# Patient Record
Sex: Female | Born: 1995 | Hispanic: No | Marital: Married | Smoking: Never smoker
Health system: Southern US, Community
[De-identification: ages and names within clinical notes are randomized; demographics above are authoritative.]

## PROBLEM LIST (undated history)

## (undated) DIAGNOSIS — R011 Cardiac murmur, unspecified: Secondary | ICD-10-CM

---

## 2021-12-17 ENCOUNTER — Emergency Department
Admission: EM | Admit: 2021-12-17 | Discharge: 2021-12-17 | Disposition: A | Discharge status: Home / Self Care | Payer: 59 | Source: Home / Self Care | Attending: Emergency Medicine | Admitting: Emergency Medicine

## 2021-12-17 ENCOUNTER — Other Ambulatory Visit: Payer: Self-pay

## 2021-12-17 ENCOUNTER — Observation Stay
Admission: RE | Admit: 2021-12-17 | Discharge: 2021-12-17 | Disposition: A | Discharge status: Home / Self Care | Payer: 59 | Attending: Obstetrics | Admitting: Obstetrics

## 2021-12-17 ENCOUNTER — Emergency Department: Payer: 59

## 2021-12-17 ENCOUNTER — Encounter: Payer: Self-pay | Admitting: Obstetrics and Gynecology

## 2021-12-17 DIAGNOSIS — R1030 Lower abdominal pain, unspecified: Secondary | ICD-10-CM | POA: Insufficient documentation

## 2021-12-17 DIAGNOSIS — O1493 Unspecified pre-eclampsia, third trimester: Secondary | ICD-10-CM | POA: Insufficient documentation

## 2021-12-17 DIAGNOSIS — Z3A27 27 weeks gestation of pregnancy: Secondary | ICD-10-CM | POA: Insufficient documentation

## 2021-12-17 DIAGNOSIS — O99352 Diseases of the nervous system complicating pregnancy, second trimester: Principal | ICD-10-CM | POA: Insufficient documentation

## 2021-12-17 DIAGNOSIS — Z3493 Encounter for supervision of normal pregnancy, unspecified, third trimester: Secondary | ICD-10-CM

## 2021-12-17 DIAGNOSIS — R55 Syncope and collapse: Secondary | ICD-10-CM | POA: Insufficient documentation

## 2021-12-17 DIAGNOSIS — Z3A26 26 weeks gestation of pregnancy: Secondary | ICD-10-CM | POA: Insufficient documentation

## 2021-12-17 DIAGNOSIS — O26892 Other specified pregnancy related conditions, second trimester: Secondary | ICD-10-CM | POA: Diagnosis not present

## 2021-12-17 DIAGNOSIS — O26893 Other specified pregnancy related conditions, third trimester: Secondary | ICD-10-CM | POA: Insufficient documentation

## 2021-12-17 DIAGNOSIS — R519 Headache, unspecified: Secondary | ICD-10-CM | POA: Insufficient documentation

## 2021-12-17 DIAGNOSIS — O162 Unspecified maternal hypertension, second trimester: Secondary | ICD-10-CM | POA: Diagnosis not present

## 2021-12-17 HISTORY — DX: Cardiac murmur, unspecified: R01.1

## 2021-12-17 LAB — CBC WITH DIFFERENTIAL/PLATELET
Abs Immature Granulocytes: 0.48 10*3/uL — ABNORMAL HIGH (ref 0.00–0.07)
Basophils Absolute: 0.1 10*3/uL (ref 0.0–0.1)
Basophils Relative: 1 %
Eosinophils Absolute: 0.1 10*3/uL (ref 0.0–0.5)
Eosinophils Relative: 1 %
HCT: 37.1 % (ref 36.0–46.0)
Hemoglobin: 12.4 g/dL (ref 12.0–15.0)
Immature Granulocytes: 4 %
Lymphocytes Relative: 14 %
Lymphs Abs: 1.6 10*3/uL (ref 0.7–4.0)
MCH: 29.8 pg (ref 26.0–34.0)
MCHC: 33.4 g/dL (ref 30.0–36.0)
MCV: 89.2 fL (ref 80.0–100.0)
Monocytes Absolute: 0.8 10*3/uL (ref 0.1–1.0)
Monocytes Relative: 7 %
Neutro Abs: 8.2 10*3/uL — ABNORMAL HIGH (ref 1.7–7.7)
Neutrophils Relative %: 73 %
Platelets: 231 10*3/uL (ref 150–400)
RBC: 4.16 MIL/uL (ref 3.87–5.11)
RDW: 12.5 % (ref 11.5–15.5)
WBC: 11.3 10*3/uL — ABNORMAL HIGH (ref 4.0–10.5)
nRBC: 0 % (ref 0.0–0.2)

## 2021-12-17 LAB — COMPREHENSIVE METABOLIC PANEL
ALT: 29 U/L (ref 0–44)
AST: 45 U/L — ABNORMAL HIGH (ref 15–41)
Albumin: 3.4 g/dL — ABNORMAL LOW (ref 3.5–5.0)
Alkaline Phosphatase: 60 U/L (ref 38–126)
Anion gap: 8 (ref 5–15)
BUN: 8 mg/dL (ref 6–20)
CO2: 24 mmol/L (ref 22–32)
Calcium: 9.3 mg/dL (ref 8.9–10.3)
Chloride: 106 mmol/L (ref 98–111)
Creatinine, Ser: 0.49 mg/dL (ref 0.44–1.00)
GFR, Estimated: >60 mL/min (ref >60)
Glucose, Bld: 100 mg/dL — ABNORMAL HIGH (ref 70–99)
Potassium: 3.1 mmol/L — ABNORMAL LOW (ref 3.5–5.1)
Sodium: 138 mmol/L (ref 135–145)
Total Bilirubin: 0.4 mg/dL (ref 0.3–1.2)
Total Protein: 6.4 g/dL — ABNORMAL LOW (ref 6.5–8.1)

## 2021-12-17 LAB — URINALYSIS, ROUTINE W REFLEX MICROSCOPIC
Bilirubin Urine: NEGATIVE
Glucose, UA: NEGATIVE mg/dL
Hgb urine dipstick: NEGATIVE
Ketones, ur: NEGATIVE mg/dL
Leukocytes,Ua: NEGATIVE
Nitrite: NEGATIVE
Protein, ur: NEGATIVE mg/dL
Specific Gravity, Urine: 1.009 (ref 1.005–1.030)
pH: 7 (ref 5.0–8.0)

## 2021-12-17 LAB — LIPASE, BLOOD: Lipase: 36 U/L (ref 11–51)

## 2021-12-17 LAB — PROTEIN / CREATININE RATIO, URINE
Creatinine, Urine: 54 mg/dL
Total Protein, Urine: <6 mg/dL

## 2021-12-17 LAB — LACTATE DEHYDROGENASE: LDH: 161 U/L (ref 98–192)

## 2021-12-17 LAB — URIC ACID: Uric Acid, Serum: 4.2 mg/dL (ref 2.5–7.1)

## 2021-12-17 LAB — MAGNESIUM: Magnesium: 2 mg/dL (ref 1.7–2.4)

## 2021-12-17 IMAGING — MR MR HEAD W/O CM
11 series · 45 of 48 positions shown · non-contrast
Comparison: None Available.

CLINICAL DATA: Headache. Twenty-six weeks pregnant. Rule out
aneurysm.

EXAM:
MRI HEAD WITHOUT CONTRAST
MRA HEAD WITHOUT CONTRAST
MRV HEAD  WITHOUT CONTRAST
TECHNIQUE: Multiplanar, multiecho pulse sequences of the brain and surrounding
structures were obtained without intravenous contrast. Angiographic
images of the Circle of Willis were obtained using MRA technique
without intravenous contrast. MR venogram of the head was performed
without contrast. Carotid stenosis measurements (when applicable)
are obtained utilizing NASCET criteria, using the distal internal
carotid diameter as the denominator.

[Series 5: ax dwi_tracew · axial · 3.0mm · 0.65mm/px · z∈[-126,+16]mm · 5 of 48 slices shown]
[im 1/48]
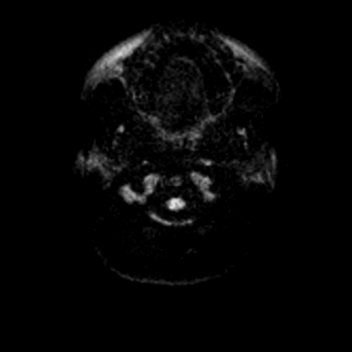
[im 12/48]
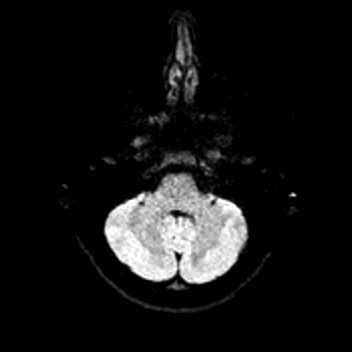
[im 24/48]
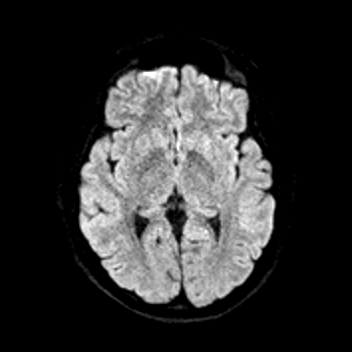
[im 36/48]
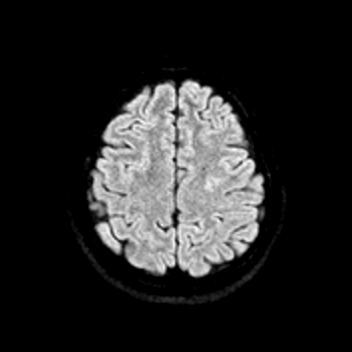
[im 48/48]
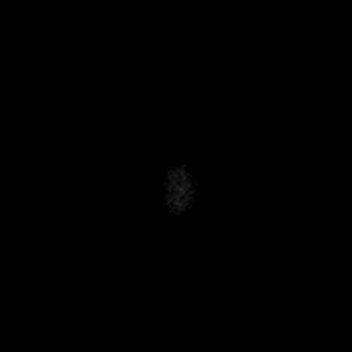

[Series 6: ax dwi_adc · axial · 3.0mm · 0.65mm/px · z∈[-126,+16]mm · 4 of 48 slices shown]
[im 1/48]
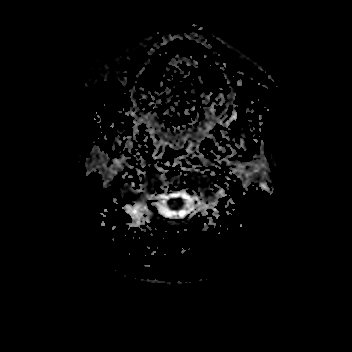
[im 16/48]
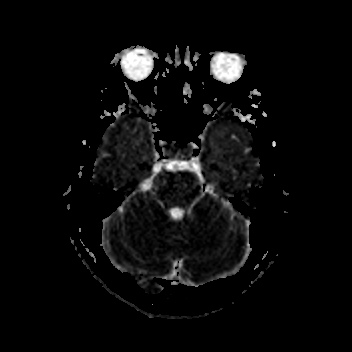
[im 32/48]
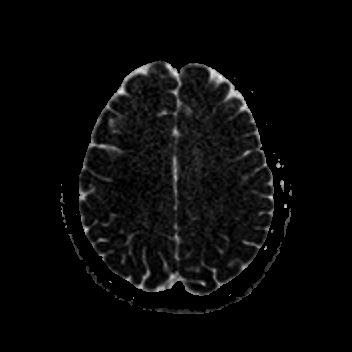
[im 48/48]
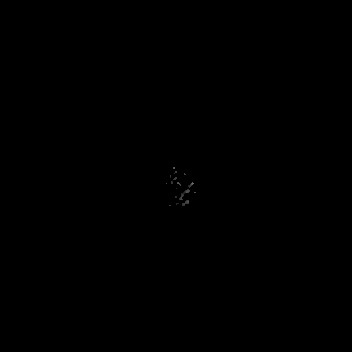

[Series 7: cor dwi_tracew · coronal · 5.0mm · 0.65mm/px · 3 of 40 slices shown]
[im 1/40]
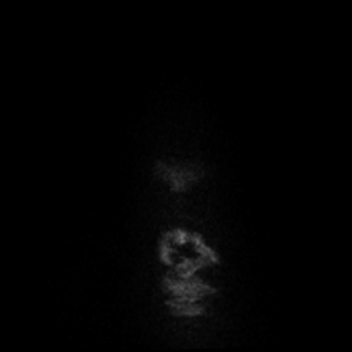
[im 20/40]
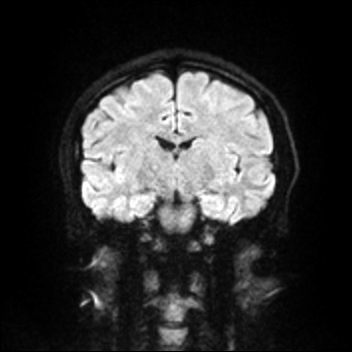
[im 40/40]
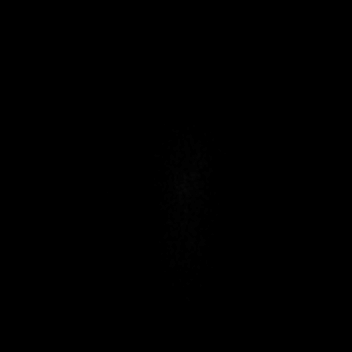

[Series 8: cor dwi_adc · coronal · 5.0mm · 0.65mm/px · 3 of 39 slices shown]
[im 1/39]
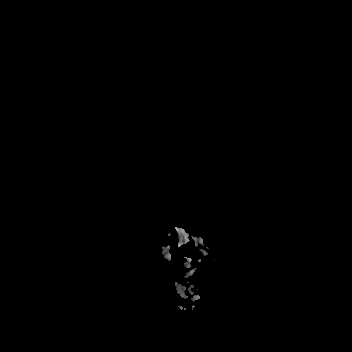
[im 20/39]
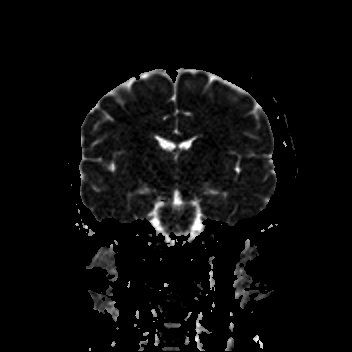
[im 39/39]
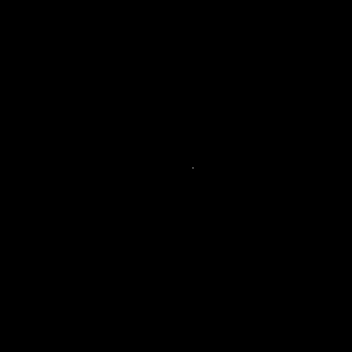

[Series 9: T1 · sagittal · 5.0mm · 0.62mm/px · 2 of 25 slices shown (1 of 2)]
[im 1/25]
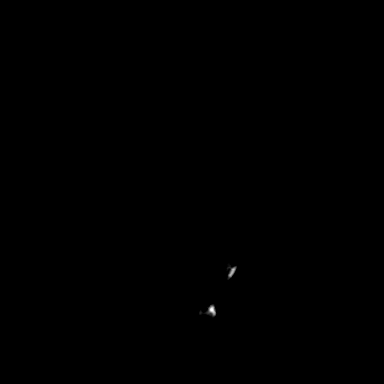
[im 25/25]
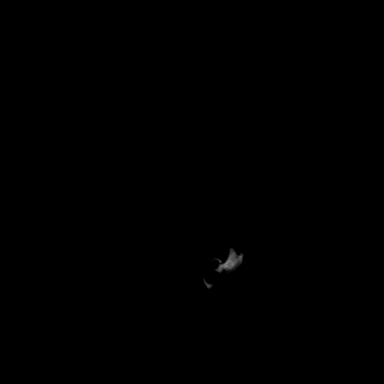

[Series 10: T2 · axial · 5.0mm · 0.53mm/px · z∈[-116,+16]mm · 2 of 25 slices shown (1 of 2)]
[im 1/25]
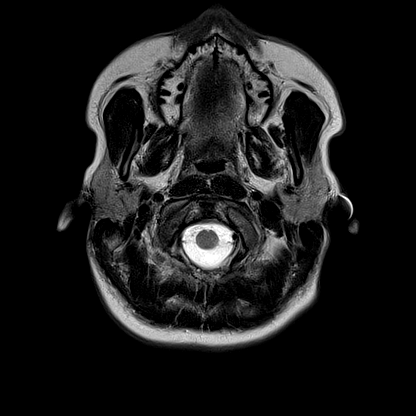
[im 25/25]
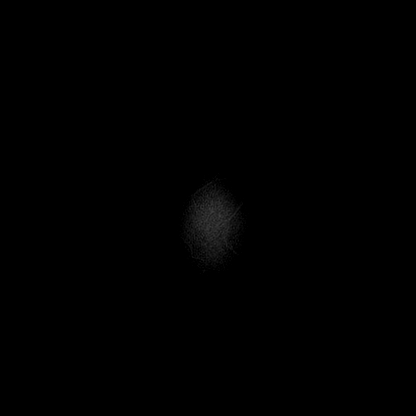

[Series 12: pha_images · axial · 3.0mm · 0.90mm/px · z∈[-133,+16]mm · 4 of 55 slices shown]
[im 1/55]
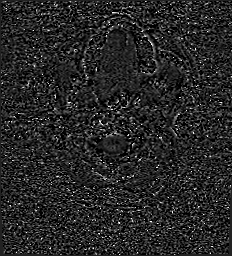
[im 19/55]
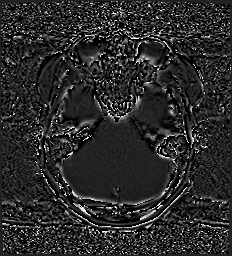
[im 37/55]
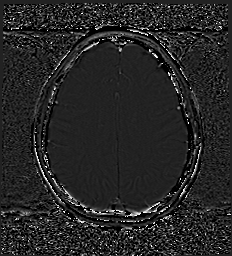
[im 55/55]
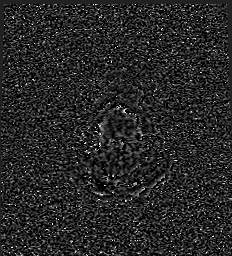

[Series 13: swi_images · axial · 3.0mm · 0.90mm/px · z∈[-133,+29]mm · 5 of 60 slices shown]
[im 1/60]
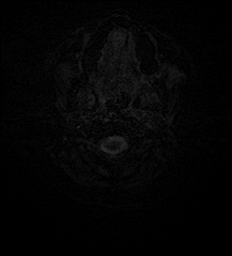
[im 15/60]
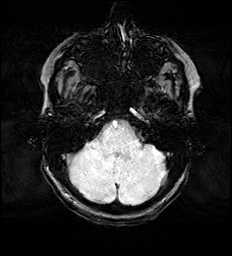
[im 30/60]
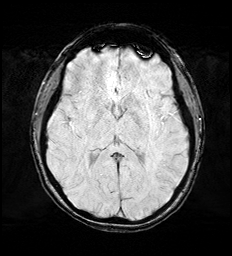
[im 45/60]
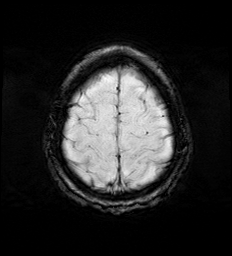
[im 60/60]
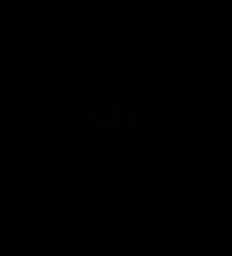

[Series 15: FLAIR · axial · 3.0mm · 0.53mm/px · z∈[-124,+24]mm · 4 of 55 slices shown]
[im 1/55]
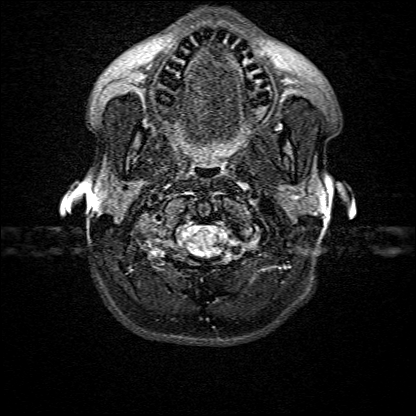
[im 19/55]
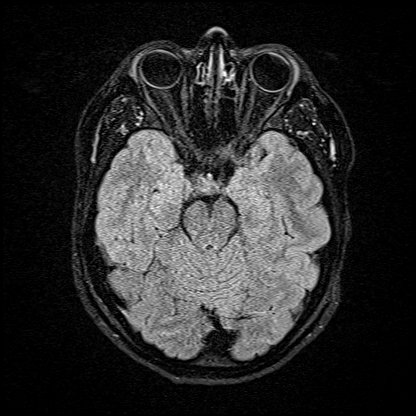
[im 37/55]
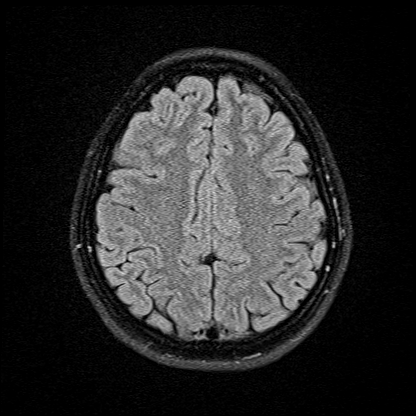
[im 55/55]
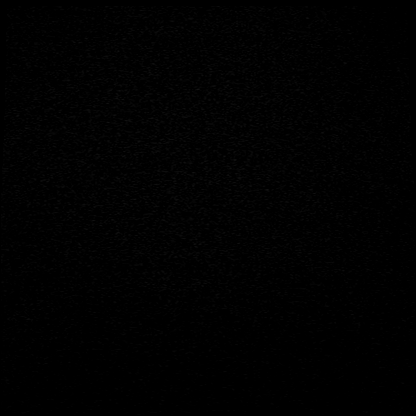

[Series 16: T1 · axial · 1.0mm · 0.98mm/px · z∈[-141,+20]mm · 11 of 176 slices shown (2 of 2)]
[im 1/176]
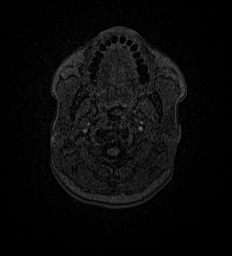
[im 14/176]
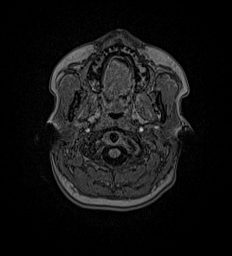
[im 27/176]
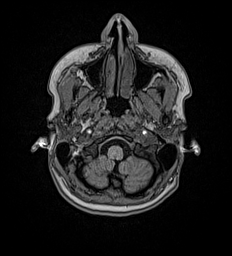
[im 41/176]
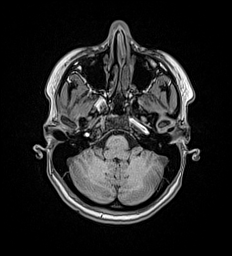
[im 54/176]
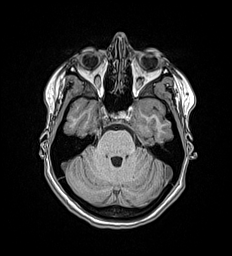
[im 68/176]
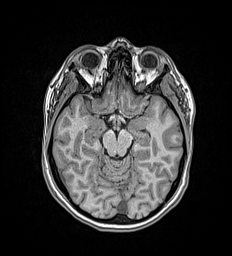
[im 81/176]
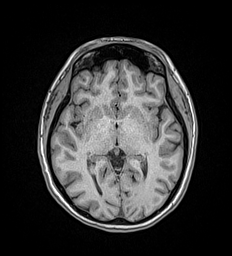
[im 95/176]
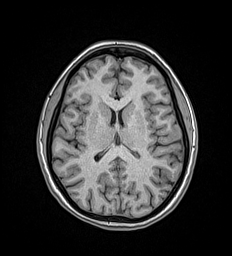
[im 122/176]
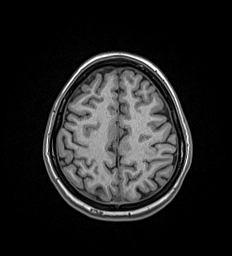
[im 149/176]
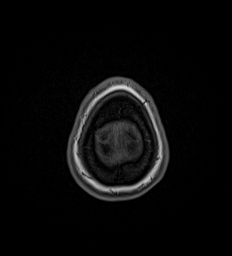
[im 176/176]
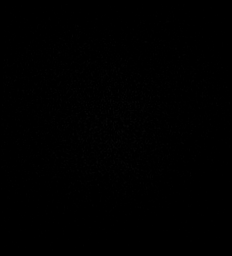

[Series 17: T2 · coronal · 5.0mm · 0.57mm/px · 2 of 29 slices shown (2 of 2)]
[im 1/29]
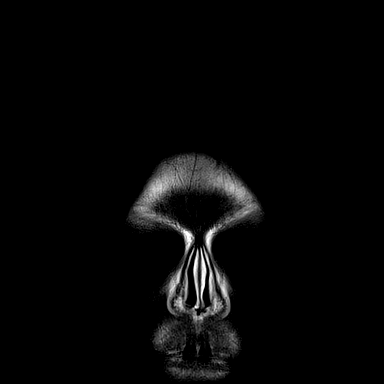
[im 29/29]
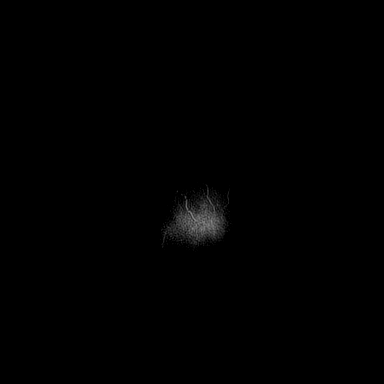

[45 of 48 positions shown; findings below may reference images not displayed]

FINDINGS: MRI HEAD FINDINGS

Brain: No acute infarction, hemorrhage, hydrocephalus, extra-axial
collection or mass lesion. Normal white matter.

Vascular: Normal arterial flow voids

Skull and upper cervical spine: Normal

Sinuses/Orbits: Mild mucosal edema paranasal sinuses. Negative orbit

Other: None

MRA HEAD FINDINGS

Internal carotid artery widely patent. Anterior middle cerebral
arteries patent. No stenosis or aneurysm.

Both vertebral arteries patent to the basilar. Basilar widely
patent. PICA, AICA, superior cerebellar, and posterior cerebral
arteries widely patent. No stenosis or aneurysm.

MRV HEAD  FINDINGS

Superior sagittal sinus normal. Internal cerebral veins and straight
sinus patent. Sigmoid sinus and transverse sinus patent bilaterally.
IMPRESSION: 1. Normal MRI head without contrast
2. Normal MRA head
3. Normal MR venogram of the head

## 2021-12-17 IMAGING — MR MR MRA HEAD W/O CM
1 series · 27 of 48 positions shown · non-contrast
Comparison: None Available.

CLINICAL DATA: Headache. Twenty-six weeks pregnant. Rule out
aneurysm.

EXAM:
MRI HEAD WITHOUT CONTRAST
MRA HEAD WITHOUT CONTRAST
MRV HEAD  WITHOUT CONTRAST
TECHNIQUE: Multiplanar, multiecho pulse sequences of the brain and surrounding
structures were obtained without intravenous contrast. Angiographic
images of the Circle of Willis were obtained using MRA technique
without intravenous contrast. MR venogram of the head was performed
without contrast. Carotid stenosis measurements (when applicable)
are obtained utilizing NASCET criteria, using the distal internal
carotid diameter as the denominator.

[Series 9: TOF · axial · 0.5mm · 0.48mm/px · z∈[-120,-31]mm · 27 of 217 slices shown]
[im 1/217]
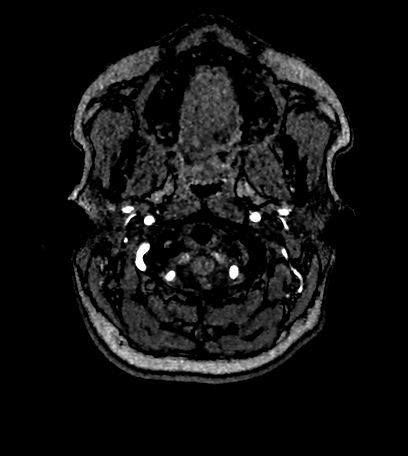
[im 5/217]
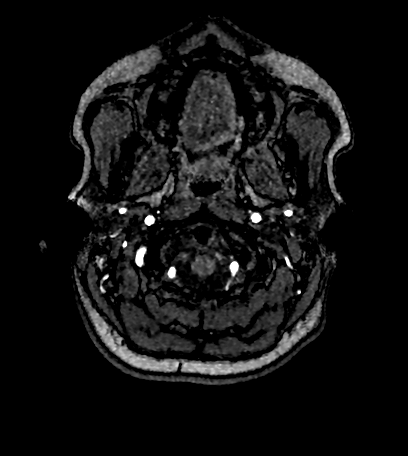
[im 10/217]
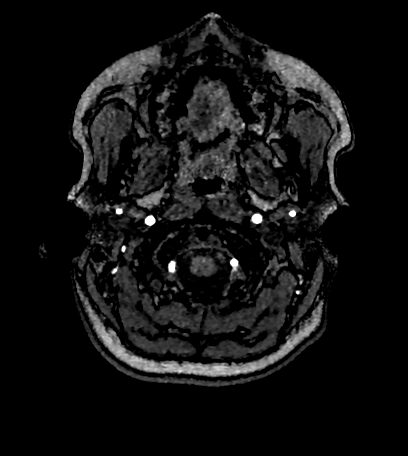
[im 14/217]
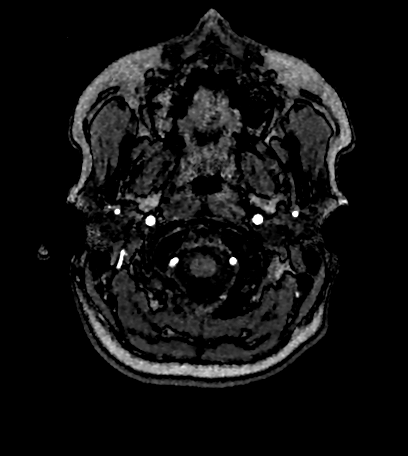
[im 19/217]
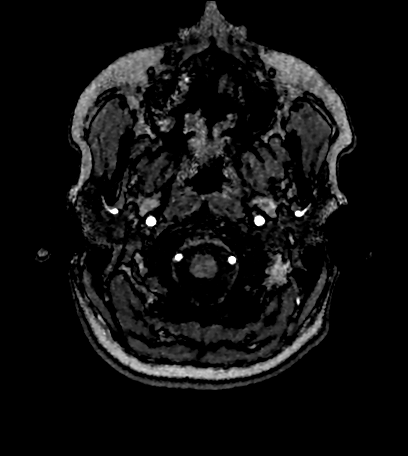
[im 23/217]
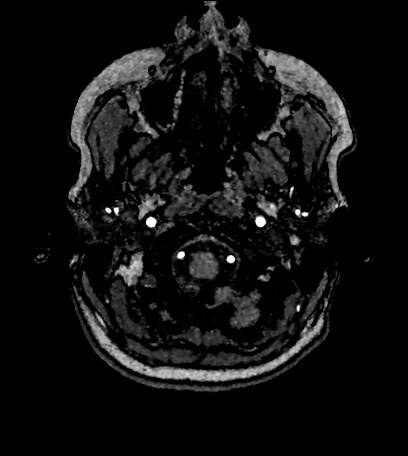
[im 28/217]
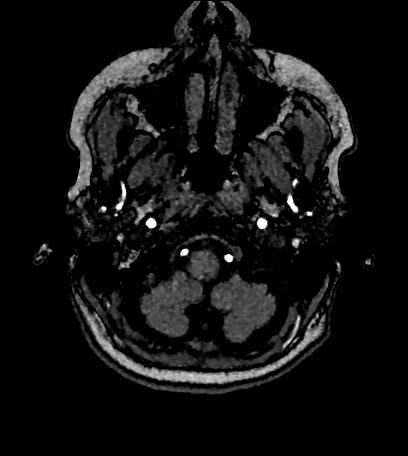
[im 33/217]
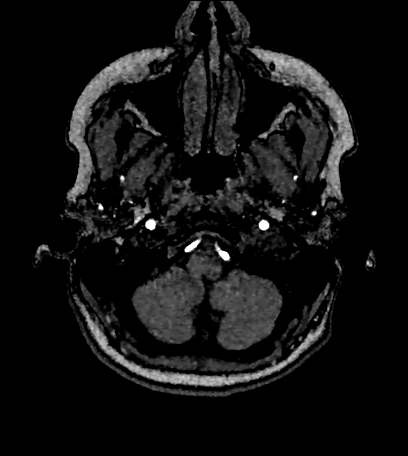
[im 37/217]
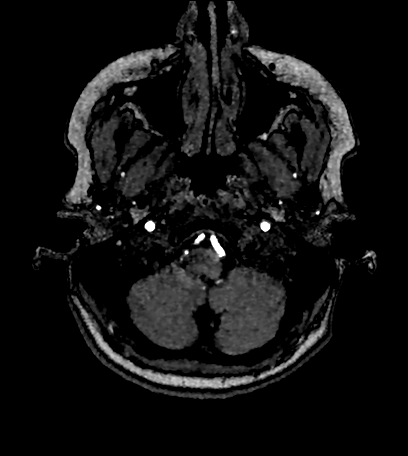
[im 42/217]
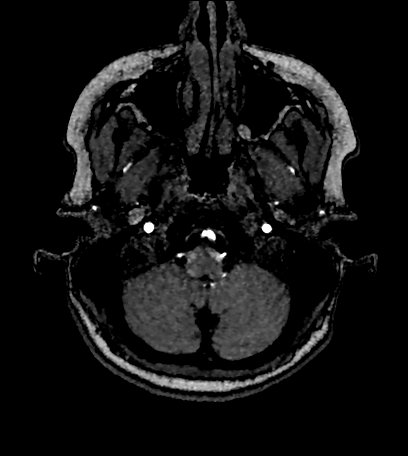
[im 46/217]
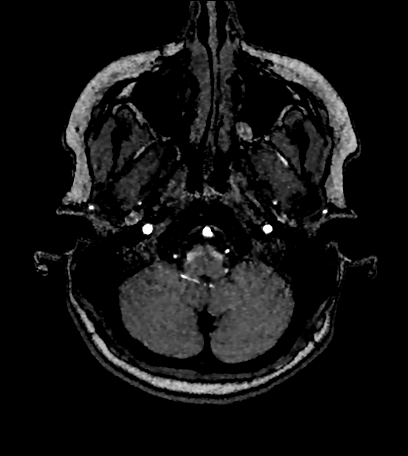
[im 51/217]
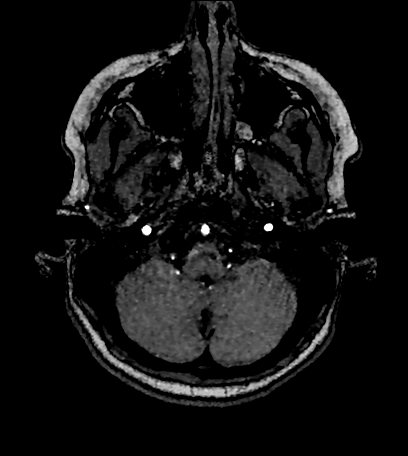
[im 56/217]
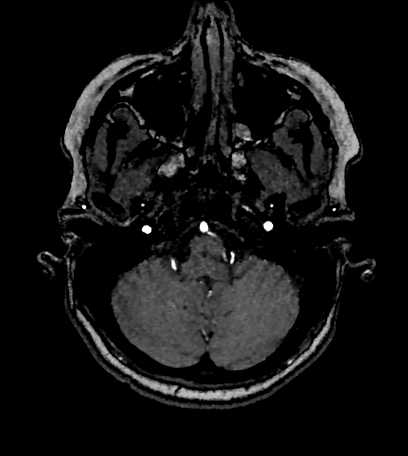
[im 60/217]
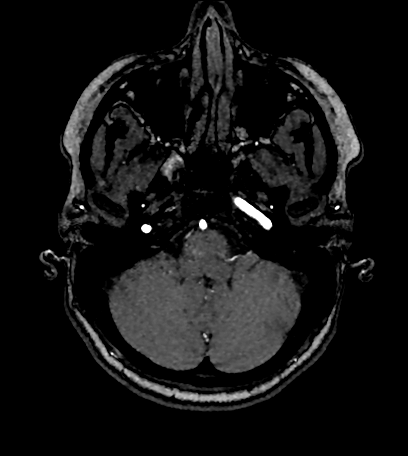
[im 65/217]
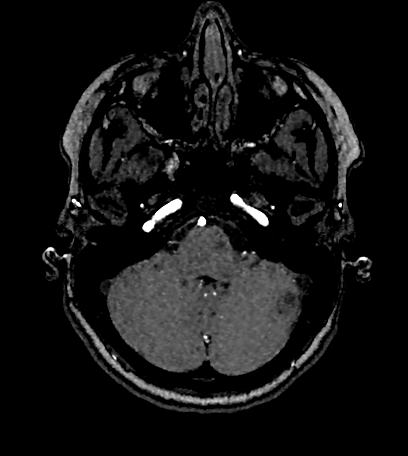
[im 69/217]
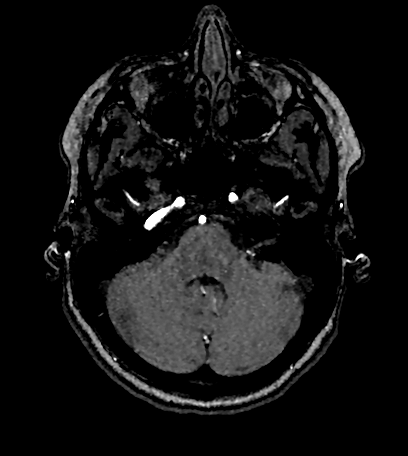
[im 74/217]
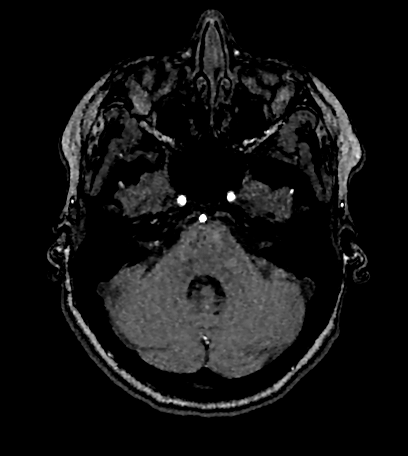
[im 79/217]
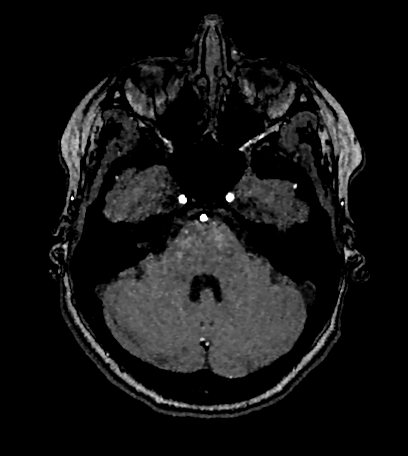
[im 83/217]
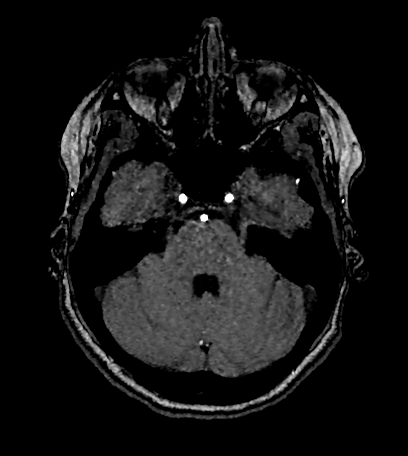
[im 88/217]
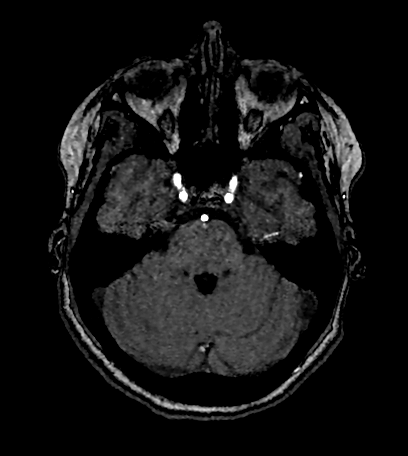
[im 97/217]
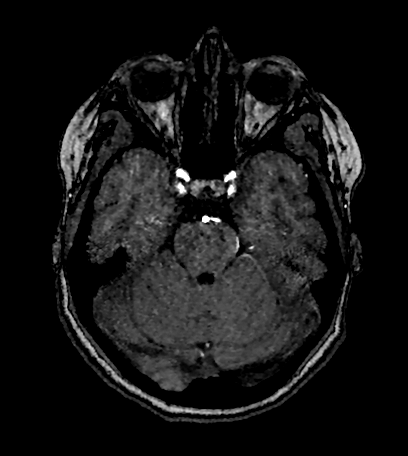
[im 111/217]
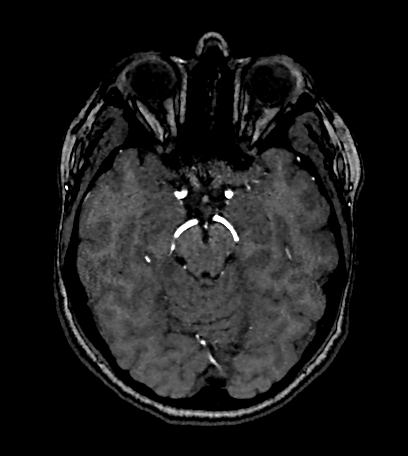
[im 125/217]
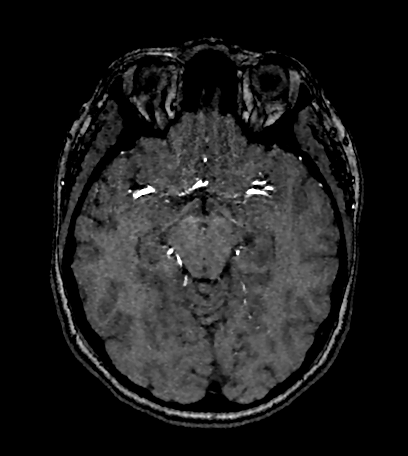
[im 152/217]
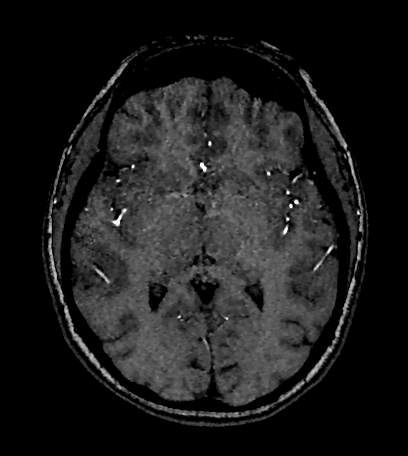
[im 180/217]
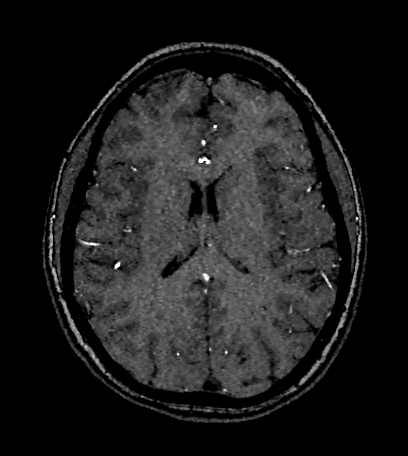
[im 184/217]
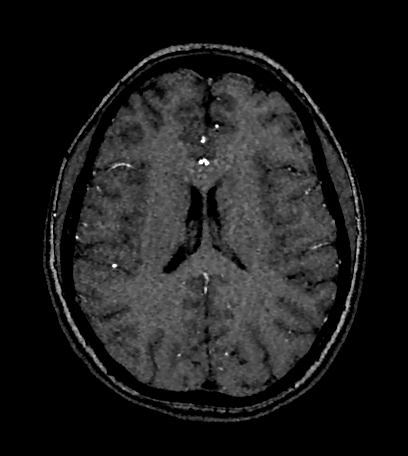
[im 207/217]
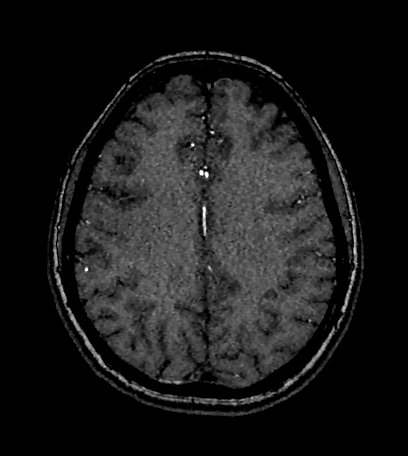

[27 of 48 positions shown; findings below may reference images not displayed]

FINDINGS: MRI HEAD FINDINGS

Brain: No acute infarction, hemorrhage, hydrocephalus, extra-axial
collection or mass lesion. Normal white matter.

Vascular: Normal arterial flow voids

Skull and upper cervical spine: Normal

Sinuses/Orbits: Mild mucosal edema paranasal sinuses. Negative orbit

Other: None

MRA HEAD FINDINGS

Internal carotid artery widely patent. Anterior middle cerebral
arteries patent. No stenosis or aneurysm.

Both vertebral arteries patent to the basilar. Basilar widely
patent. PICA, AICA, superior cerebellar, and posterior cerebral
arteries widely patent. No stenosis or aneurysm.

MRV HEAD  FINDINGS

Superior sagittal sinus normal. Internal cerebral veins and straight
sinus patent. Sigmoid sinus and transverse sinus patent bilaterally.
IMPRESSION: 1. Normal MRI head without contrast
2. Normal MRA head
3. Normal MR venogram of the head

## 2021-12-17 IMAGING — MR MR MRV HEAD W/O CM
1 of 3 series · 30 of 48 positions shown · non-contrast
Comparison: None Available.

CLINICAL DATA: Headache. Twenty-six weeks pregnant. Rule out
aneurysm.

EXAM:
MRI HEAD WITHOUT CONTRAST
MRA HEAD WITHOUT CONTRAST
MRV HEAD  WITHOUT CONTRAST
TECHNIQUE: Multiplanar, multiecho pulse sequences of the brain and surrounding
structures were obtained without intravenous contrast. Angiographic
images of the Circle of Willis were obtained using MRA technique
without intravenous contrast. MR venogram of the head was performed
without contrast. Carotid stenosis measurements (when applicable)
are obtained utilizing NASCET criteria, using the distal internal
carotid diameter as the denominator.

[Series 9: TOF · coronal · 2.5mm · 0.98mm/px · 30 of 128 slices shown]
[im 1/128]
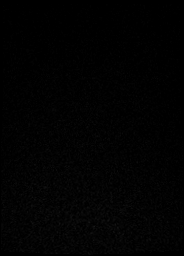
[im 5/128]
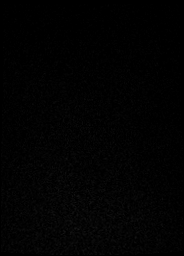
[im 9/128]
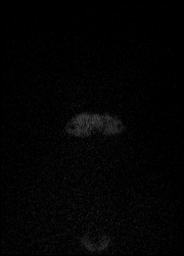
[im 14/128]
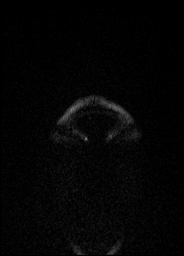
[im 18/128]
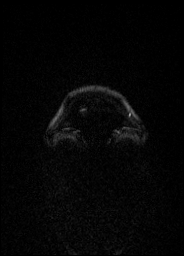
[im 22/128]
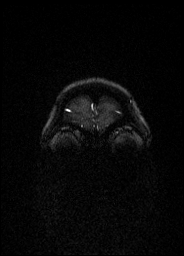
[im 27/128]
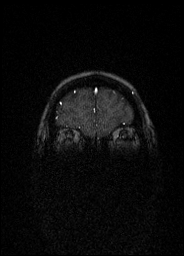
[im 31/128]
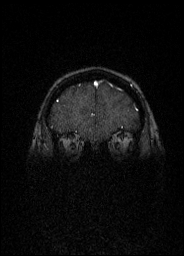
[im 36/128]
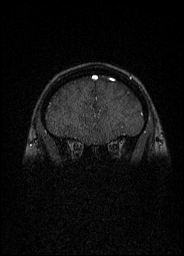
[im 40/128]
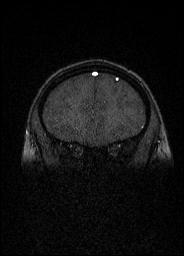
[im 44/128]
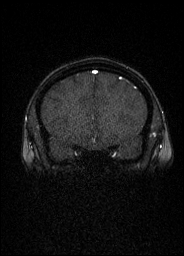
[im 49/128]
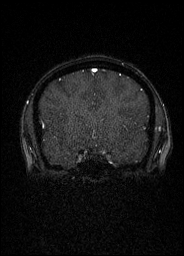
[im 53/128]
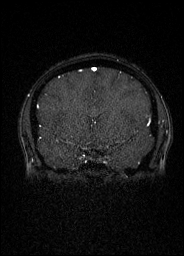
[im 57/128]
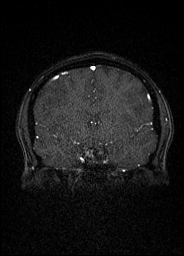
[im 62/128]
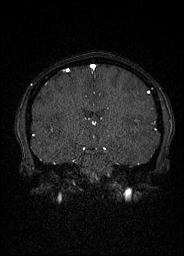
[im 66/128]
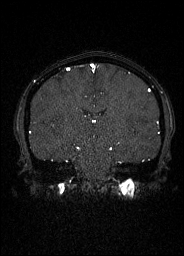
[im 71/128]
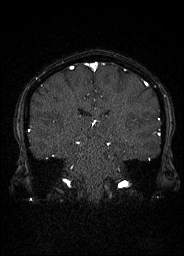
[im 75/128]
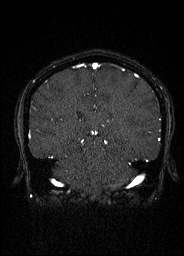
[im 79/128]
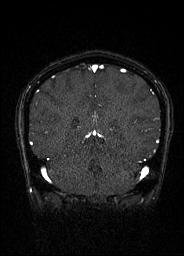
[im 84/128]
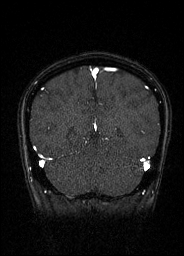
[im 88/128]
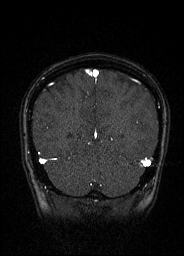
[im 92/128]
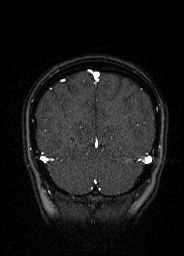
[im 97/128]
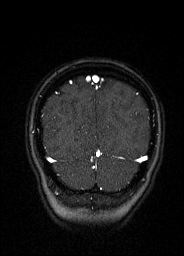
[im 101/128]
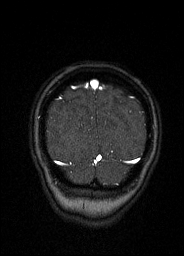
[im 106/128]
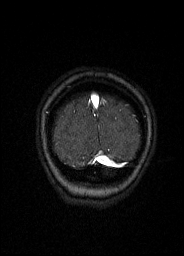
[im 110/128]
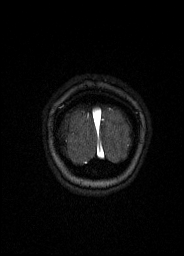
[im 114/128]
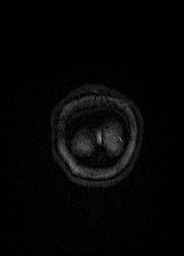
[im 119/128]
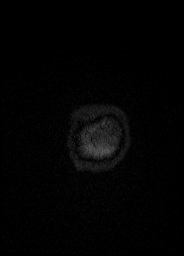
[im 123/128]
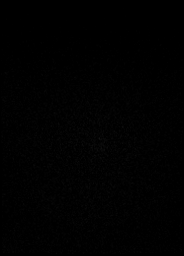
[im 128/128]
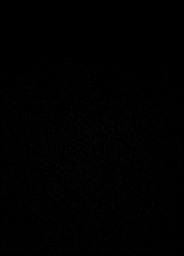

[30 of 48 positions shown; findings below may reference images not displayed]

FINDINGS: MRI HEAD FINDINGS

Brain: No acute infarction, hemorrhage, hydrocephalus, extra-axial
collection or mass lesion. Normal white matter.

Vascular: Normal arterial flow voids

Skull and upper cervical spine: Normal

Sinuses/Orbits: Mild mucosal edema paranasal sinuses. Negative orbit

Other: None

MRA HEAD FINDINGS

Internal carotid artery widely patent. Anterior middle cerebral
arteries patent. No stenosis or aneurysm.

Both vertebral arteries patent to the basilar. Basilar widely
patent. PICA, AICA, superior cerebellar, and posterior cerebral
arteries widely patent. No stenosis or aneurysm.

MRV HEAD  FINDINGS

Superior sagittal sinus normal. Internal cerebral veins and straight
sinus patent. Sigmoid sinus and transverse sinus patent bilaterally.
IMPRESSION: 1. Normal MRI head without contrast
2. Normal MRA head
3. Normal MR venogram of the head

## 2021-12-17 MED ORDER — DIPHENHYDRAMINE HCL 50 MG/ML IJ SOLN
25.0000 mg | Freq: Once | INTRAMUSCULAR | Status: AC
  Administered 2021-12-17: 25 mg via INTRAVENOUS
  Filled 2021-12-17: qty 1

## 2021-12-17 MED ORDER — ACETAMINOPHEN 10 MG/ML IV SOLN
1000.0000 mg | INTRAVENOUS | Status: DC
  Filled 2021-12-17: qty 100

## 2021-12-17 MED ORDER — NIFEDIPINE ER OSMOTIC RELEASE 30 MG PO TB24
30.0000 mg | ORAL_TABLET | Freq: Once | ORAL | Status: DC
  Filled 2021-12-17 (×2): qty 1

## 2021-12-17 MED ORDER — ONDANSETRON 4 MG PO TBDP
4.0000 mg | ORAL_TABLET | Freq: Once | ORAL | Status: AC
  Administered 2021-12-17: 4 mg via ORAL
  Filled 2021-12-17: qty 1

## 2021-12-17 MED ORDER — ONDANSETRON HCL 4 MG PO TABS: 4.0000 mg | ORAL_TABLET | Freq: Three times a day (TID) | ORAL | 0 refills | Status: AC | PRN

## 2021-12-17 MED ORDER — NIFEDIPINE ER OSMOTIC RELEASE 30 MG PO TB24: 30.0000 mg | ORAL_TABLET | Freq: Every day | ORAL | 0 refills | Status: AC

## 2021-12-17 MED ORDER — METOCLOPRAMIDE HCL 5 MG/ML IJ SOLN
10.0000 mg | INTRAMUSCULAR | Status: DC
  Filled 2021-12-17: qty 2

## 2021-12-17 MED ORDER — ONDANSETRON HCL 4 MG/2ML IJ SOLN: 4.0000 mg | Freq: Four times a day (QID) | INTRAMUSCULAR | Status: DC | PRN

## 2021-12-17 MED ORDER — LACTATED RINGERS IV BOLUS
1000.0000 mL | Freq: Once | INTRAVENOUS | Status: AC
  Administered 2021-12-17: 1000 mL via INTRAVENOUS

## 2021-12-17 MED ORDER — ACETAMINOPHEN 325 MG PO TABS: 650.0000 mg | ORAL_TABLET | ORAL | Status: DC | PRN

## 2021-12-17 NOTE — ED Provider Notes (Signed)
MRI is normal.  Patient stable and appropriate for discharge to L&D for NST monitoring.   Willy Eddy, MD 12/17/21 [phone]

## 2021-12-17 NOTE — ED Notes (Signed)
3rd floor called report given pt will go to obs #4

## 2021-12-17 NOTE — ED Notes (Signed)
Pt a G1P0

## 2021-12-17 NOTE — OB Triage Note (Signed)
LABOR & DELIVERY OB TRIAGE NOTE  SUBJECTIVE  HPI Kimberly Berg is a 26 y.o. G1P0 at [redacted]w[redacted]d who presents to Labor & Delivery from the ED for a NST before discharge. She had a syncopal episode today at work after experiencing a headache and tunnel vision. She was cleared medically by the ED. She is still experiencing some nausea. She had some elevated BPs in the ED with normal labs. She has an appointment with her OB tomorrow. OB History     Gravida  1   Para      Term      Preterm      AB      Living         SAB      IAB      Ectopic      Multiple      Live Births              Scheduled Meds:   Continuous Infusions: PRN Meds:.acetaminophen, ondansetron  OBJECTIVE  BP 123/63 (BP Location: Right Arm)   Pulse 86   Temp 98.5 F (36.9 C) (Oral)   Resp 16   Cardiac: RRR, no murmur Lungs: CTAB Abdomen: soft, gravid, non-tender  NST I reviewed the NST and it was reactive.  Baseline: 135 Variability: moderate Accelerations: present;15x15 Decelerations:none Toco: no contractions Category 1  ASSESSMENT Impression  1) Pregnancy at G1P0, [redacted]w[redacted]d, Estimated Date of Delivery: 03/20/22 2) Reassuring maternal/fetal status  PLAN  1) Discharge home with standard labor/return precautions 2) Follow up tomorrow with established OB provider.  Lloyd Huger, CNM

## 2021-12-17 NOTE — ED Provider Notes (Signed)
[facility] Provider Note    Event Date/Time   First MD Initiated Contact with Patient 12/17/21 1310     (approximate)   History   Chief Complaint: Near Syncope (X 2 episodes passing out , lower abd pain , fetal heart tones 140, pt axox4 )   HPI  Kimberly Berg is a 26 y.o. female G1, P0 at [redacted] weeks pregnant, followed by [facility] who comes the ED complaining of syncope.  She reports having "migraine" headaches for the past several days, and today while working (which involves prolonged periods of standing) she started to get lightheaded, tunnel vision, felt like she was going to pass out, so a coworker lowered her to a chair and she did not fall.  When she regained consciousness, she had a recurrent episode of feeling lightheaded and passing out. Denies any recent fevers chills chest pain shortness of breath cough dysuria vomiting diarrhea body aches  She reports that prior to pregnancy, she did have episodes of syncope in the past, and has had multiple episodes of syncope throughout pregnancy as well.  She does not have a prior history of recurrent headaches or migraine syndrome, and that has just developed during pregnancy and been ongoing for several weeks.  She also notes that her blood pressure has been elevated in the range of [id number] systolic over the past several weeks.  She reports some lower extremity edema.  With the headache she has blurry vision.  Review of outside records shows patient had a prenatal visit at 16 weeks pregnancy, blood pressure at that time was 119/76.     Physical Exam   Triage Vital Signs: ED Triage Vitals [12/17/21 1312]  Enc Vitals Group     BP (!) 151/89     Pulse Rate (!) 103     Resp 18     Temp 98.5 F (36.9 C)     Temp Source Oral     SpO2 100 %     Weight 187 lb 6.3 oz (85 kg)     Height 5\' 4"  (1.626 m)     Head Circumference      Peak Flow      Pain Score 8     Pain Loc      Pain Edu?      Excl. in [place]?      Most recent vital signs: Vitals:   12/17/21 1330 12/17/21 1430  BP: (!) 162/104 (!) 149/88  Pulse: 99 90  Resp: 16 (!) 21  Temp:  98.5 F (36.9 C)  SpO2: 100% 100%    General: Awake, no distress.  CV:  Good peripheral perfusion.  Resp:  Normal effort.  Abd:  No distention.  Gravid, nontender, size consistent with dates.  Bedside point-of-care ultrasound shows grossly normal amniotic fluid volume, gross fetal movements, normal fetal heart rate of about 140. Other:  Trace pitting edema bilateral lower extremities, symmetric.  No calf tenderness.  No rash.   ED Results / Procedures / Treatments   Labs (all labs ordered are listed, but only abnormal results are displayed) Labs Reviewed  URINALYSIS, ROUTINE W REFLEX MICROSCOPIC - Abnormal; Notable for the following components:      Result Value   Color, Urine YELLOW (*)    APPearance HAZY (*)    All other components within normal limits  COMPREHENSIVE METABOLIC PANEL - Abnormal; Notable for the following components:   Potassium 3.1 (*)    Glucose, Bld 100 (*)  Total Protein 6.4 (*)    Albumin 3.4 (*)    AST 45 (*)    All other components within normal limits  CBC WITH DIFFERENTIAL/PLATELET - Abnormal; Notable for the following components:   WBC 11.3 (*)    Neutro Abs 8.2 (*)    Abs Immature Granulocytes 0.48 (*)    All other components within normal limits  LIPASE, BLOOD  MAGNESIUM  URIC ACID  LACTATE DEHYDROGENASE  PROTEIN / CREATININE RATIO, URINE     EKG Interpreted by me Sinus rhythm rate of 97.  Normal axis, normal intervals.  Normal QRS ST segments and T waves.  No evidence of underlying dysrhythmia.   RADIOLOGY MRI/MRA/MRV head pending.   PROCEDURES:  Procedures   MEDICATIONS ORDERED IN ED: Medications  metoCLOPramide (REGLAN) injection 10 mg (10 mg Intravenous Not Given 12/17/21 1411)  acetaminophen (OFIRMEV) IV 1,000 mg (1,000 mg Intravenous Not Given 12/17/21 1409)  NIFEdipine  (PROCARDIA-XL/NIFEDICAL-XL) 24 hr tablet 30 mg (has no administration in time range)  lactated ringers bolus 1,000 mL (1,000 mLs Intravenous New Bag/Given 12/17/21 1411)  diphenhydrAMINE (BENADRYL) injection 25 mg (25 mg Intravenous Given 12/17/21 1410)     IMPRESSION / MDM / ASSESSMENT AND PLAN / ED COURSE  I reviewed the triage vital signs and the nursing notes.                              Differential diagnosis includes, but is not limited to, dehydration, anemia, electrolyte abnormality, renal insufficiency, preeclampsia, dural sinus thrombosis, cerebral aneurysm, cerebral edema  Patient's presentation is most consistent with acute presentation with potential threat to life or bodily function.  Patient presents with elevated blood pressure, trace pedal edema, new persistent headache pattern in the setting of third trimester pregnancy.  2 episodes of syncope today.  We will need to obtain labs, MRI/MRA/MRV.  Vital signs are unremarkable in the ED except for blood pressure 150/90.  ----------------------------------------- 3:34 PM on 12/17/2021 ----------------------------------------- Serum labs all normal.  No proteinuria.  Repeat blood pressure 135/78, heart rate of 90.  Awaiting MRI.  ----------------------------------------- 3:38 PM on 12/17/2021 ----------------------------------------- Discussed with OB Dr. Marcelline Mates, agrees with starting some antihypertensives now, Procardia 30 mg daily.  We will plan for transfer up to labor and delivery for NST after MRI is complete.       FINAL CLINICAL IMPRESSION(S) / ED DIAGNOSES   Final diagnoses:  Syncope, unspecified syncope type  Third trimester pregnancy at less than 36 weeks  Pre-eclampsia in third trimester     Rx / DC Orders   ED Discharge Orders          Ordered    NIFEdipine (PROCARDIA-XL/NIFEDICAL-XL) 30 MG 24 hr tablet  Daily        12/17/21 1539             Note:  This document was prepared using Dragon  voice recognition software and may include unintentional dictation errors.   Carrie Mew, MD 12/17/21 1540

## 2021-12-17 NOTE — ED Triage Notes (Signed)
X 2 episodes passing out , lower abd pain , fetal heart tones 140, pt axox4

## 2021-12-17 NOTE — OB Triage Note (Signed)
Patient is a 26 yo, G1P0, at 26 weeks 5 days. Patient presents with complaints of a syncopal episode that occurred at her job earlier today. Patient states she experienced tunnel vision and passed out twice, falling on her right side the second time. Patient denies any vaginal bleeding or LOF. Pt reports +FM. Patient states she has a follow up appt with her OB at [facility] tomorrow. Monitors applied and assessing. VSS. Initial fetal heart tone 145. Will continue to monitor.

## 2021-12-17 NOTE — Progress Notes (Signed)
Discharge instructions provided to patient. Patient verbalized understanding. Pt educated on signs and symptoms of labor, vaginal bleeding, LOF, fetal movement, and when to return to the hospital. Red flag signs reviewed by RN. Patient discharged home with significant other in stable condition.  ?
